# Patient Record
Sex: Male | Born: 1950 | Race: Black or African American | Hispanic: No | Marital: Single | Smoking: Former smoker
Health system: Southern US, Community
[De-identification: ages and names within clinical notes are randomized; demographics above are authoritative.]

## PROBLEM LIST (undated history)

## (undated) DIAGNOSIS — I10 Essential (primary) hypertension: Secondary | ICD-10-CM

---

## 2014-05-20 ENCOUNTER — Emergency Department (HOSPITAL_COMMUNITY)
Admission: EM | Admit: 2014-05-20 | Discharge: 2014-05-21 | Disposition: A | Discharge status: Home / Self Care | Attending: Emergency Medicine | Admitting: Emergency Medicine

## 2014-05-20 ENCOUNTER — Emergency Department (HOSPITAL_COMMUNITY)

## 2014-05-20 ENCOUNTER — Encounter (HOSPITAL_COMMUNITY): Payer: Self-pay | Admitting: Emergency Medicine

## 2014-05-20 DIAGNOSIS — K439 Ventral hernia without obstruction or gangrene: Secondary | ICD-10-CM | POA: Insufficient documentation

## 2014-05-20 DIAGNOSIS — R748 Abnormal levels of other serum enzymes: Secondary | ICD-10-CM

## 2014-05-20 DIAGNOSIS — R7401 Elevation of levels of liver transaminase levels: Secondary | ICD-10-CM | POA: Insufficient documentation

## 2014-05-20 DIAGNOSIS — R109 Unspecified abdominal pain: Secondary | ICD-10-CM

## 2014-05-20 DIAGNOSIS — N289 Disorder of kidney and ureter, unspecified: Secondary | ICD-10-CM

## 2014-05-20 DIAGNOSIS — I10 Essential (primary) hypertension: Secondary | ICD-10-CM | POA: Insufficient documentation

## 2014-05-20 DIAGNOSIS — D171 Benign lipomatous neoplasm of skin and subcutaneous tissue of trunk: Secondary | ICD-10-CM

## 2014-05-20 DIAGNOSIS — D1739 Benign lipomatous neoplasm of skin and subcutaneous tissue of other sites: Secondary | ICD-10-CM | POA: Insufficient documentation

## 2014-05-20 DIAGNOSIS — Z87891 Personal history of nicotine dependence: Secondary | ICD-10-CM | POA: Insufficient documentation

## 2014-05-20 DIAGNOSIS — R7402 Elevation of levels of lactic acid dehydrogenase (LDH): Secondary | ICD-10-CM | POA: Insufficient documentation

## 2014-05-20 DIAGNOSIS — R740 Nonspecific elevation of levels of transaminase and lactic acid dehydrogenase [LDH]: Secondary | ICD-10-CM

## 2014-05-20 DIAGNOSIS — R1084 Generalized abdominal pain: Secondary | ICD-10-CM | POA: Insufficient documentation

## 2014-05-20 HISTORY — DX: Essential (primary) hypertension: I10

## 2014-05-20 NOTE — ED Notes (Signed)
Pt c/o pain to right side which has a knot about the size of a tennis ball. Pt also c/o abdominal pain.  denies n/v.

## 2014-05-20 NOTE — ED Provider Notes (Signed)
CSN: [id number]     Arrival date & time 05/20/14  2127 History   First MD Initiated Contact with Patient 05/20/14 2326     Chief complaint: Abdominal pain  (Consider location/radiation/quality/duration/timing/severity/associated sxs/prior Treatment) The history is provided by the patient.   63 year old male comes in because of pain in the right side of his abdomen. Pain has been intermittent for several weeks and is associated with a knot on the right side of his abdomen. He has noted a knot for about a year and it tends to wax and wane in size. Pain was severe when it started at about 3 PM today, and he rated it a 10/10. It has subsided to a 3/10. He describes it as a hard cramp. There is associated nausea but no vomiting. He denies any diarrhea or constipation. Appetite has been normal. There's been no fever, chills, sweats. Nothing made the pain better nothing made it worse.   Past Medical History  Diagnosis Date  . Hypertension    History reviewed. No pertinent past surgical history. History reviewed. No pertinent family history. History  Substance Use Topics  . Smoking status: Former Research scientist (life sciences)  . Smokeless tobacco: Not on file  . Alcohol Use: No    Review of Systems  All other systems reviewed and are negative.     Allergies  Review of patient's allergies indicates no known allergies.  Home Medications   Prior to Admission medications   Not on File   BP 141/95  Pulse 62  Temp(Src) 97.9 F (36.6 C)  Resp 20  Ht 5\' 11"  (1.803 m)  Wt 208 lb (94.348 kg)  BMI 29.02 kg/m2  SpO2 94% Physical Exam  Nursing note and vitals reviewed.  63 year old male, resting comfortably and in no acute distress. Vital signs are significant for hypertension with blood pressure 141/95. Oxygen saturation is 94%, which is normal. Head is normocephalic and atraumatic. PERRLA, EOMI. Oropharynx is clear. Neck is nontender and supple without adenopathy or JVD. Back is nontender and there is no  CVA tenderness. Lungs are clear without rales, wheezes, or rhonchi. Chest is nontender. Heart has regular rate and rhythm without murmur. Abdomen is soft, flat, with mild tenderness diffusely. There is no rebound or guarding. There is a subcutaneous mass in the right lateral abdomen which has consistency of a lipoma. It measures 8 cm x 5 cm. There is no overlying erythema or warmth. There are no other masses or hepatosplenomegaly and peristalsis is normoactive. Extremities have no cyanosis or edema, full range of motion is present. Skin is warm and dry without rash. Neurologic: Mental status is normal, cranial nerves are intact, there are no motor or sensory deficits.  ED Course  Procedures (including critical care time) Labs Review Results for orders placed during the hospital encounter of 05/20/14  CBC WITH DIFFERENTIAL      Result Value Ref Range   WBC 3.7 (*) 4.0 - 10.5 K/uL   RBC 4.80  4.22 - 5.81 MIL/uL   Hemoglobin 13.4  13.0 - 17.0 g/dL   HCT 41.0  39.0 - 52.0 %   MCV 85.4  78.0 - 100.0 fL   MCH 27.9  26.0 - 34.0 pg   MCHC 32.7  30.0 - 36.0 g/dL   RDW 13.7  11.5 - 15.5 %   Platelets 193  150 - 400 K/uL   Neutrophils Relative % 55  43 - 77 %   Neutro Abs 2.0  1.7 - 7.7 K/uL  Lymphocytes Relative 34  12 - 46 %   Lymphs Abs 1.3  0.7 - 4.0 K/uL   Monocytes Relative 10  3 - 12 %   Monocytes Absolute 0.4  0.1 - 1.0 K/uL   Eosinophils Relative 1  0 - 5 %   Eosinophils Absolute 0.1  0.0 - 0.7 K/uL   Basophils Relative 0  0 - 1 %   Basophils Absolute 0.0  0.0 - 0.1 K/uL  URINALYSIS, ROUTINE W REFLEX MICROSCOPIC      Result Value Ref Range   Color, Urine YELLOW  YELLOW   APPearance CLEAR  CLEAR   Specific Gravity, Urine 1.020  1.005 - 1.030   pH 6.0  5.0 - 8.0   Glucose, UA NEGATIVE  NEGATIVE mg/dL   Hgb urine dipstick TRACE (*) NEGATIVE   Bilirubin Urine NEGATIVE  NEGATIVE   Ketones, ur NEGATIVE  NEGATIVE mg/dL   Protein, ur 100 (*) NEGATIVE mg/dL   Urobilinogen, UA 0.2   0.0 - 1.0 mg/dL   Nitrite NEGATIVE  NEGATIVE   Leukocytes, UA NEGATIVE  NEGATIVE  COMPREHENSIVE METABOLIC PANEL      Result Value Ref Range   Sodium 137  137 - 147 mEq/L   Potassium 3.9  3.7 - 5.3 mEq/L   Chloride 98  96 - 112 mEq/L   CO2 24  19 - 32 mEq/L   Glucose, Bld 105 (*) 70 - 99 mg/dL   BUN 20  6 - 23 mg/dL   Creatinine, Ser 1.57 (*) 0.50 - 1.35 mg/dL   Calcium 9.5  8.4 - 10.5 mg/dL   Total Protein 8.0  6.0 - 8.3 g/dL   Albumin 3.9  3.5 - 5.2 g/dL   AST 44 (*) 0 - 37 U/L   ALT 55 (*) 0 - 53 U/L   Alkaline Phosphatase 68  39 - 117 U/L   Total Bilirubin 0.3  0.3 - 1.2 mg/dL   GFR calc non Af Amer 45 (*) >90 mL/min   GFR calc Af Amer 52 (*) >90 mL/min  LIPASE, BLOOD      Result Value Ref Range   Lipase 75 (*) 11 - 59 U/L  URINE MICROSCOPIC-ADD ON      Result Value Ref Range   Squamous Epithelial / LPF RARE  RARE   WBC, UA 0-2  <3 WBC/hpf   RBC / HPF 0-2  <3 RBC/hpf   Bacteria, UA RARE  RARE  Imaging Review Ct Abdomen Pelvis W Contrast  05/21/2014   CLINICAL DATA:  Right-sided abdominal pain.  Abdominal mass.  EXAM: CT ABDOMEN AND PELVIS WITH CONTRAST  TECHNIQUE: Multidetector CT imaging of the abdomen and pelvis was performed using the standard protocol following bolus administration of intravenous contrast.  CONTRAST:  43mL OMNIPAQUE IOHEXOL 300 MG/ML SOLN, 146mL OMNIPAQUE IOHEXOL 300 MG/ML SOLN  COMPARISON:  None.  FINDINGS: There is a 6.8 x 8.5 x 2.0 cm subcutaneous lipoma superficial to the right lateral abdominal wall musculature. This has an appearance consistent with a benign lipoma.  There is also a 2.5 cm mass in the subcutaneous fat just to the left of midline 6 cm above the umbilicus which I think represents a tiny anterior abdominal wall hernia. There is some fat and some fluid or inflamed or infarcted fat in this tiny hernia.  Liver, biliary tree, spleen, pancreas, adrenal glands, and kidneys are normal except for a 13 mm cyst in the mid right kidney. The bowel is  normal including the terminal ileum  and appendix. Bladder is normal. No free air free fluid. The patient has severe degenerative disc disease at L4-5 and L5-S1.  IMPRESSION:  SIGNED REPORT: IMPRESSION:  SIGNED REPORT  1. There is a benign-appearing subcutaneous lipoma just superficial to the musculature of the right lateral abdominal wall. 2. Small midline abdominal wall hernias 6 cm above umbilicus. 3. Otherwise benign appearing abdomen.   Electronically Signed   By: Rozetta Nunnery M.D.   On: 05/21/2014 01:35   Images viewed by me.  MDM   Final diagnoses:  Abdominal pain  Lipoma of abdominal wall  Ventral hernia  Renal insufficiency  Elevated transaminase level  Elevated lipase    Abdominal pain of uncertain cause. I do not feel it is related to the mass, which appears to be a lipoma. He'll be sent for CT to make sure there is no other serious cause for his pain. He has no prior records and the [facility] system.  CT confirms that the mass in his right lateral abdomen is a lipoma. Small ventral hernia is present and some of his symptoms but no other pathology is identified. Laboratory workup shows mild elevation of lipase which is probably not acute pancreatitis. Mild elevation of transaminases is present of doubtful clinical significance. Mild renal insufficiency is present. I [facility] and creatinine is unchanged from baseline but transaminases are slightly elevated compared to what they had been recently. This will need to be followed as an outpatient. No indication for hospital medicine at this point. He is discharged with prescriptions for tramadol and ondansetron and is referred back to the prison physician.  Delora Fuel, MD 63/01/60 1093

## 2014-05-21 LAB — CBC WITH DIFFERENTIAL/PLATELET
Basophils Absolute: 0 10*3/uL (ref 0.0–0.1)
Basophils Relative: 0 % (ref 0–1)
EOS ABS: 0.1 10*3/uL (ref 0.0–0.7)
EOS PCT: 1 % (ref 0–5)
HEMATOCRIT: 41 % (ref 39.0–52.0)
Hemoglobin: 13.4 g/dL (ref 13.0–17.0)
LYMPHS ABS: 1.3 10*3/uL (ref 0.7–4.0)
Lymphocytes Relative: 34 % (ref 12–46)
MCH: 27.9 pg (ref 26.0–34.0)
MCHC: 32.7 g/dL (ref 30.0–36.0)
MCV: 85.4 fL (ref 78.0–100.0)
Monocytes Absolute: 0.4 10*3/uL (ref 0.1–1.0)
Monocytes Relative: 10 % (ref 3–12)
Neutro Abs: 2 10*3/uL (ref 1.7–7.7)
Neutrophils Relative %: 55 % (ref 43–77)
Platelets: 193 10*3/uL (ref 150–400)
RBC: 4.8 MIL/uL (ref 4.22–5.81)
RDW: 13.7 % (ref 11.5–15.5)
WBC: 3.7 10*3/uL — ABNORMAL LOW (ref 4.0–10.5)

## 2014-05-21 LAB — COMPREHENSIVE METABOLIC PANEL
ALT: 55 U/L — ABNORMAL HIGH (ref 0–53)
AST: 44 U/L — ABNORMAL HIGH (ref 0–37)
Albumin: 3.9 g/dL (ref 3.5–5.2)
Alkaline Phosphatase: 68 U/L (ref 39–117)
BILIRUBIN TOTAL: 0.3 mg/dL (ref 0.3–1.2)
BUN: 20 mg/dL (ref 6–23)
CHLORIDE: 98 meq/L (ref 96–112)
CO2: 24 meq/L (ref 19–32)
Calcium: 9.5 mg/dL (ref 8.4–10.5)
Creatinine, Ser: 1.57 mg/dL — ABNORMAL HIGH (ref 0.50–1.35)
GFR, EST AFRICAN AMERICAN: 52 mL/min — AB (ref >90)
GFR, EST NON AFRICAN AMERICAN: 45 mL/min — AB (ref >90)
GLUCOSE: 105 mg/dL — AB (ref 70–99)
Potassium: 3.9 mEq/L (ref 3.7–5.3)
Sodium: 137 mEq/L (ref 137–147)
Total Protein: 8 g/dL (ref 6.0–8.3)

## 2014-05-21 LAB — URINALYSIS, ROUTINE W REFLEX MICROSCOPIC
BILIRUBIN URINE: NEGATIVE
GLUCOSE, UA: NEGATIVE mg/dL
KETONES UR: NEGATIVE mg/dL
Leukocytes, UA: NEGATIVE
Nitrite: NEGATIVE
PH: 6 (ref 5.0–8.0)
Protein, ur: 100 mg/dL — AB
Specific Gravity, Urine: 1.02 (ref 1.005–1.030)
Urobilinogen, UA: 0.2 mg/dL (ref 0.0–1.0)

## 2014-05-21 LAB — URINE MICROSCOPIC-ADD ON

## 2014-05-21 LAB — LIPASE, BLOOD: LIPASE: 75 U/L — AB (ref 11–59)

## 2014-05-21 MED ORDER — IOHEXOL 300 MG/ML  SOLN
50.0000 mL | Freq: Once | INTRAMUSCULAR | Status: AC | PRN
  Administered 2014-05-21: 50 mL via ORAL

## 2014-05-21 MED ORDER — IOHEXOL 300 MG/ML  SOLN
100.0000 mL | Freq: Once | INTRAMUSCULAR | Status: AC | PRN
  Administered 2014-05-21: 100 mL via INTRAVENOUS

## 2014-05-21 MED ORDER — ONDANSETRON HCL 4 MG PO TABS: 4.0000 mg | ORAL_TABLET | Freq: Four times a day (QID) | ORAL | Status: DC | PRN

## 2014-05-21 MED ORDER — TRAMADOL HCL 50 MG PO TABS
50.0000 mg | ORAL_TABLET | Freq: Once | ORAL | Status: AC
  Administered 2014-05-21: 50 mg via ORAL
  Filled 2014-05-21: qty 1

## 2014-05-21 MED ORDER — TRAMADOL HCL 50 MG PO TABS: 50.0000 mg | ORAL_TABLET | Freq: Four times a day (QID) | ORAL | Status: DC | PRN

## 2014-05-21 NOTE — Discharge Instructions (Signed)
Abdominal Pain, Adult Many things can cause abdominal pain. Usually, abdominal pain is not caused by a disease and will improve without treatment. It can often be observed and treated at home. Your health care provider will do a physical exam and possibly order blood tests and X-rays to help determine the seriousness of your pain. However, in many cases, more time must pass before a clear cause of the pain can be found. Before that point, your health care provider may not know if you need more testing or further treatment. HOME CARE INSTRUCTIONS  Monitor your abdominal pain for any changes. The following actions may help to alleviate any discomfort you are experiencing:  Only take over-the-counter or prescription medicines as directed by your health care provider.  Do not take laxatives unless directed to do so by your health care provider.  Try a clear liquid diet (broth, tea, or water) as directed by your health care provider. Slowly move to a bland diet as tolerated. SEEK MEDICAL CARE IF:  You have unexplained abdominal pain.  You have abdominal pain associated with nausea or diarrhea.  You have pain when you urinate or have a bowel movement.  You experience abdominal pain that wakes you in the night.  You have abdominal pain that is worsened or improved by eating food.  You have abdominal pain that is worsened with eating fatty foods. SEEK IMMEDIATE MEDICAL CARE IF:   Your pain does not go away within 2 hours.  You have a fever.  You keep throwing up (vomiting).  Your pain is felt only in portions of the abdomen, such as the right side or the left lower portion of the abdomen.  You pass bloody or black tarry stools. MAKE SURE YOU:  Understand these instructions.   Will watch your condition.   Will get help right away if you are not doing well or get worse.  Document Released: 09/25/2005 Document Revised: 10/06/2013 Document Reviewed: 08/25/2013 [employer] Patient  Information 2014 [place].  Tramadol tablets What is this medicine? TRAMADOL (TRA ma dole) is a pain reliever. It is used to treat moderate to severe pain in adults. This medicine may be used for other purposes; ask your health care provider or pharmacist if you have questions. COMMON BRAND NAME(S): Ultram What should I tell my health care provider before I take this medicine? They need to know if you have any of these conditions: -brain tumor -depression -drug abuse or addiction -head injury -if you frequently drink alcohol containing drinks -kidney disease or trouble passing urine -liver disease -lung disease, asthma, or breathing problems -seizures or epilepsy -suicidal thoughts, plans, or attempt; a previous suicide attempt by you or a family member -an unusual or allergic reaction to tramadol, codeine, other medicines, foods, dyes, or preservatives -pregnant or trying to get pregnant -breast-feeding How should I use this medicine? Take this medicine by mouth with a full glass of water. Follow the directions on the prescription label. If the medicine upsets your stomach, take it with food or milk. Do not take more medicine than you are told to take. Talk to your pediatrician regarding the use of this medicine in children. Special care may be needed. Overdosage: If you think you have taken too much of this medicine contact a poison control center or emergency room at once. NOTE: This medicine is only for you. Do not share this medicine with others. What if I miss a dose? If you miss a dose, take it as soon as  you can. If it is almost time for your next dose, take only that dose. Do not take double or extra doses. What may interact with this medicine? Do not take this medicine with any of the following medications: -MAOIs like Carbex, Eldepryl, Marplan, Nardil, and Parnate This medicine may also interact with the following medications: -alcohol or medicines that contain  alcohol -antihistamines -benzodiazepines -bupropion -carbamazepine or oxcarbazepine -clozapine -cyclobenzaprine -digoxin -furazolidone -linezolid -medicines for depression, anxiety, or psychotic disturbances -medicines for migraine headache like almotriptan, eletriptan, frovatriptan, naratriptan, rizatriptan, sumatriptan, zolmitriptan -medicines for pain like pentazocine, buprenorphine, butorphanol, meperidine, nalbuphine, and propoxyphene -medicines for sleep -muscle relaxants -naltrexone -phenobarbital -phenothiazines like perphenazine, thioridazine, chlorpromazine, mesoridazine, fluphenazine, prochlorperazine, promazine, and trifluoperazine -procarbazine -warfarin This list may not describe all possible interactions. Give your health care provider a list of all the medicines, herbs, non-prescription drugs, or dietary supplements you use. Also tell them if you smoke, drink alcohol, or use illegal drugs. Some items may interact with your medicine. What should I watch for while using this medicine? Tell your doctor or health care professional if your pain does not go away, if it gets worse, or if you have new or a different type of pain. You may develop tolerance to the medicine. Tolerance means that you will need a higher dose of the medicine for pain relief. Tolerance is normal and is expected if you take this medicine for a long time. Do not suddenly stop taking your medicine because you may develop a severe reaction. Your body becomes used to the medicine. This does NOT mean you are addicted. Addiction is a behavior related to getting and using a drug for a non-medical reason. If you have pain, you have a medical reason to take pain medicine. Your doctor will tell you how much medicine to take. If your doctor wants you to stop the medicine, the dose will be slowly lowered over time to avoid any side effects. You may get drowsy or dizzy. Do not drive, use machinery, or do anything that  needs mental alertness until you know how this medicine affects you. Do not stand or sit up quickly, especially if you are an older patient. This reduces the risk of dizzy or fainting spells. Alcohol can increase or decrease the effects of this medicine. Avoid alcoholic drinks. You may have constipation. Try to have a bowel movement at least every 2 to 3 days. If you do not have a bowel movement for 3 days, call your doctor or health care professional. Your mouth may get dry. Chewing sugarless gum or sucking hard candy, and drinking plenty of water may help. Contact your doctor if the problem does not go away or is severe. What side effects may I notice from receiving this medicine? Side effects that you should report to your doctor or health care professional as soon as possible: -allergic reactions like skin rash, itching or hives, swelling of the face, lips, or tongue -breathing difficulties, wheezing -confusion -itching -light headedness or fainting spells -redness, blistering, peeling or loosening of the skin, including inside the mouth -seizures Side effects that usually do not require medical attention (report to your doctor or health care professional if they continue or are bothersome): -constipation -dizziness -drowsiness -headache -nausea, vomiting This list may not describe all possible side effects. Call your doctor for medical advice about side effects. You may report side effects to FDA at 1-800-FDA-1088. Where should I keep my medicine? Keep out of the reach of children. Store at room  temperature between 15 and 30 degrees C (59 and 86 degrees F). Keep container tightly closed. Throw away any unused medicine after the expiration date. NOTE: This sheet is a summary. It may not cover all possible information. If you have questions about this medicine, talk to your doctor, pharmacist, or health care provider.  2014, [employer]vier/[employer] Standard. (2010-08-29 11:55:44)  Ondansetron  tablets What is this medicine? ONDANSETRON (on DAN se tron) is used to treat nausea and vomiting caused by chemotherapy. It is also used to prevent or treat nausea and vomiting after surgery. This medicine may be used for other purposes; ask your health care provider or pharmacist if you have questions. COMMON BRAND NAME(S): Zofran What should I tell my health care provider before I take this medicine? They need to know if you have any of these conditions: -heart disease -history of irregular heartbeat -liver disease -low levels of magnesium or potassium in the blood -an unusual or allergic reaction to ondansetron, granisetron, other medicines, foods, dyes, or preservatives -pregnant or trying to get pregnant -breast-feeding How should I use this medicine? Take this medicine by mouth with a glass of water. Follow the directions on your prescription label. Take your doses at regular intervals. Do not take your medicine more often than directed. Talk to your pediatrician regarding the use of this medicine in children. Special care may be needed. Overdosage: If you think you have taken too much of this medicine contact a poison control center or emergency room at once. NOTE: This medicine is only for you. Do not share this medicine with others. What if I miss a dose? If you miss a dose, take it as soon as you can. If it is almost time for your next dose, take only that dose. Do not take double or extra doses. What may interact with this medicine? Do not take this medicine with any of the following medications: -apomorphine -certain medicines for fungal infections like fluconazole, itraconazole, ketoconazole, posaconazole, voriconazole -cisapride -dofetilide -dronedarone -pimozide -thioridazine -ziprasidone  This medicine may also interact with the following medications: -carbamazepine -certain medicines for depression, anxiety, or psychotic disturbances -fentanyl -linezolid -MAOIs  like Carbex, Eldepryl, Marplan, Nardil, and Parnate -methylene blue (injected into a vein) -other medicines that prolong the QT interval (cause an abnormal heart rhythm) -phenytoin -rifampicin -tramadol This list may not describe all possible interactions. Give your health care provider a list of all the medicines, herbs, non-prescription drugs, or dietary supplements you use. Also tell them if you smoke, drink alcohol, or use illegal drugs. Some items may interact with your medicine. What should I watch for while using this medicine? Check with your doctor or health care professional right away if you have any sign of an allergic reaction. What side effects may I notice from receiving this medicine? Side effects that you should report to your doctor or health care professional as soon as possible: -allergic reactions like skin rash, itching or hives, swelling of the face, lips or tongue -breathing problems -confusion -dizziness -fast or irregular heartbeat -feeling faint or lightheaded, falls -fever and chills -loss of balance or coordination -seizures -sweating -swelling of the hands or feet -tightness in the chest -tremors -unusually weak or tired Side effects that usually do not require medical attention (report to your doctor or health care professional if they continue or are bothersome): -constipation or diarrhea -headache This list may not describe all possible side effects. Call your doctor for medical advice about side effects. You may report side effects  to FDA at 1-800-FDA-1088. Where should I keep my medicine? Keep out of the reach of children. Store between 2 and 30 degrees C (36 and 86 degrees F). Throw away any unused medicine after the expiration date. NOTE: This sheet is a summary. It may not cover all possible information. If you have questions about this medicine, talk to your doctor, pharmacist, or health care provider.  2014, Elsevier/Gold Standard. (2013-09-22  16:27:45)

## 2014-05-21 NOTE — ED Notes (Signed)
Report given to T. Winn RN 

## 2014-05-21 NOTE — ED Notes (Signed)
IV d/c'd; catheter intact and site WNL.  Discharge instructions and prescriptions given and reviewed with patient and Designer, industrial/product.  Patient verbalized understanding of sedating effects of Tramadol and to follow up as needed.  [facility] to [facility].

## 2014-07-26 ENCOUNTER — Emergency Department (HOSPITAL_COMMUNITY)

## 2014-07-26 ENCOUNTER — Encounter (HOSPITAL_COMMUNITY): Payer: Self-pay | Admitting: Emergency Medicine

## 2014-07-26 ENCOUNTER — Emergency Department (HOSPITAL_COMMUNITY)
Admission: EM | Admit: 2014-07-26 | Discharge: 2014-07-27 | Disposition: A | Discharge status: Home / Self Care | Attending: Emergency Medicine | Admitting: Emergency Medicine

## 2014-07-26 DIAGNOSIS — Z7982 Long term (current) use of aspirin: Secondary | ICD-10-CM | POA: Insufficient documentation

## 2014-07-26 DIAGNOSIS — M791 Myalgia, unspecified site: Secondary | ICD-10-CM

## 2014-07-26 DIAGNOSIS — I495 Sick sinus syndrome: Secondary | ICD-10-CM | POA: Insufficient documentation

## 2014-07-26 DIAGNOSIS — Z87891 Personal history of nicotine dependence: Secondary | ICD-10-CM | POA: Insufficient documentation

## 2014-07-26 DIAGNOSIS — R000 Tachycardia, unspecified: Secondary | ICD-10-CM | POA: Insufficient documentation

## 2014-07-26 DIAGNOSIS — N289 Disorder of kidney and ureter, unspecified: Secondary | ICD-10-CM | POA: Insufficient documentation

## 2014-07-26 DIAGNOSIS — R5381 Other malaise: Secondary | ICD-10-CM | POA: Insufficient documentation

## 2014-07-26 DIAGNOSIS — R61 Generalized hyperhidrosis: Secondary | ICD-10-CM | POA: Insufficient documentation

## 2014-07-26 DIAGNOSIS — I10 Essential (primary) hypertension: Secondary | ICD-10-CM | POA: Insufficient documentation

## 2014-07-26 DIAGNOSIS — R52 Pain, unspecified: Secondary | ICD-10-CM | POA: Insufficient documentation

## 2014-07-26 DIAGNOSIS — R110 Nausea: Secondary | ICD-10-CM | POA: Insufficient documentation

## 2014-07-26 DIAGNOSIS — R5383 Other fatigue: Principal | ICD-10-CM

## 2014-07-26 DIAGNOSIS — R6883 Chills (without fever): Secondary | ICD-10-CM | POA: Insufficient documentation

## 2014-07-26 DIAGNOSIS — Z79899 Other long term (current) drug therapy: Secondary | ICD-10-CM | POA: Insufficient documentation

## 2014-07-26 LAB — CBC WITH DIFFERENTIAL/PLATELET
BASOS ABS: 0 10*3/uL (ref 0.0–0.1)
Basophils Relative: 0 % (ref 0–1)
EOS ABS: 0 10*3/uL (ref 0.0–0.7)
EOS PCT: 1 % (ref 0–5)
HEMATOCRIT: 43.6 % (ref 39.0–52.0)
Hemoglobin: 14.5 g/dL (ref 13.0–17.0)
LYMPHS PCT: 19 % (ref 12–46)
Lymphs Abs: 0.4 10*3/uL — ABNORMAL LOW (ref 0.7–4.0)
MCH: 28.5 pg (ref 26.0–34.0)
MCHC: 33.3 g/dL (ref 30.0–36.0)
MCV: 85.7 fL (ref 78.0–100.0)
MONO ABS: 0 10*3/uL — AB (ref 0.1–1.0)
Monocytes Relative: 0 % — ABNORMAL LOW (ref 3–12)
Neutro Abs: 1.9 10*3/uL (ref 1.7–7.7)
Neutrophils Relative %: 80 % — ABNORMAL HIGH (ref 43–77)
Platelets: 165 10*3/uL (ref 150–400)
RBC: 5.09 MIL/uL (ref 4.22–5.81)
RDW: 14.3 % (ref 11.5–15.5)
WBC: 2.4 10*3/uL — AB (ref 4.0–10.5)

## 2014-07-26 MED ORDER — KETOROLAC TROMETHAMINE 30 MG/ML IJ SOLN
30.0000 mg | Freq: Once | INTRAMUSCULAR | Status: AC
  Administered 2014-07-26: 30 mg via INTRAVENOUS
  Filled 2014-07-26: qty 1

## 2014-07-26 MED ORDER — SODIUM CHLORIDE 0.9 % IV SOLN
1000.0000 mL | INTRAVENOUS | Status: DC
  Administered 2014-07-26: 1000 mL via INTRAVENOUS

## 2014-07-26 MED ORDER — SODIUM CHLORIDE 0.9 % IV SOLN
1000.0000 mL | Freq: Once | INTRAVENOUS | Status: AC
  Administered 2014-07-26: 1000 mL via INTRAVENOUS

## 2014-07-26 MED ORDER — ONDANSETRON HCL 4 MG/2ML IJ SOLN
4.0000 mg | Freq: Once | INTRAMUSCULAR | Status: AC
  Administered 2014-07-26: 4 mg via INTRAVENOUS
  Filled 2014-07-26: qty 2

## 2014-07-26 NOTE — ED Notes (Signed)
Patient c/o general body weakness, fever and chills. Pat sts nausea, denies vomiting and diarrhea. Patient is A&O X4

## 2014-07-26 NOTE — ED Notes (Signed)
Patient complaining of generalized body aches, chills, fever, and cough.

## 2014-07-26 NOTE — ED Provider Notes (Signed)
CSN: [id number]     Arrival date & time 07/26/14  2203 History  This chart was scribed for Raymond Fuel, MD by Vernell Barrier, ED scribe. This patient was seen in room [facility]07/[facility]07 and the patient's care was started at 11:21 PM.    Chief Complaint  Patient presents with  . Generalized Body Aches   The history is provided by the patient. No language interpreter was used.   HPI Comments: Raymond Charles is a 63 y.o. male w/ hx of HTN presents to the Emergency Department BIB police complaining of gradually worsening generalized myalgias; onset this PM. Notes worsening dry cough onset 1 day prior. Pt also notes fever, chills, SOB, nausea, and diaphoresis. States he has a hernia; pain is worsened with coughing. Denies vomiting.   Past Medical History  Diagnosis Date  . Hypertension    History reviewed. No pertinent past surgical history. History reviewed. No pertinent family history. History  Substance Use Topics  . Smoking status: Former Research scientist (life sciences)  . Smokeless tobacco: Not on file  . Alcohol Use: No    Review of Systems  Constitutional: Positive for fever, chills and diaphoresis.  Respiratory: Positive for cough and shortness of breath.   Gastrointestinal: Positive for nausea. Negative for vomiting.  Musculoskeletal: Positive for myalgias.  All other systems reviewed and are negative.  Allergies  Review of patient's allergies indicates no known allergies.  Home Medications   Prior to Admission medications   Medication Sig Start Date End Date Taking? Authorizing Provider  amLODipine (NORVASC) 5 MG tablet Take 5 mg by mouth every morning.   Yes Historical Provider, MD  aspirin EC 81 MG tablet Take 81 mg by mouth every morning.   Yes Historical Provider, MD  hydrochlorothiazide (HYDRODIURIL) 25 MG tablet Take 25 mg by mouth every morning.   Yes Historical Provider, MD  lisinopril (PRINIVIL,ZESTRIL) 20 MG tablet Take 20 mg by mouth every morning.   Yes Historical Provider, MD   ranitidine (ZANTAC) 300 MG tablet Take 300 mg by mouth 2 (two) times daily.   Yes Historical Provider, MD   Triage vitals: BP 144/95  Pulse 132  Temp(Src) 99.3 F (37.4 C) (Oral)  Ht 5\' 8"  (1.727 m)  Wt 212 lb (96.163 kg)  BMI 32.24 kg/m2  SpO2 99%  Physical Exam  Nursing note and vitals reviewed. Constitutional: He is oriented to person, place, and time. He appears well-developed and well-nourished. No distress.  Appears uncomfortable.  HENT:  Head: Normocephalic and atraumatic.  Eyes: Conjunctivae and EOM are normal. Pupils are equal, round, and reactive to light.  Neck: Normal range of motion. Neck supple. No JVD present.  Cardiovascular: Regular rhythm and normal heart sounds.  Tachycardia present.   No murmur heard. Pulmonary/Chest: Effort normal and breath sounds normal. No respiratory distress. He has no wheezes. He has no rales.  Abdominal: Soft. Bowel sounds are normal. He exhibits no distension and no mass. There is no tenderness.  Musculoskeletal: Normal range of motion. He exhibits no edema.  Lymphadenopathy:    He has no cervical adenopathy.  Neurological: He is alert and oriented to person, place, and time. He has normal reflexes. No cranial nerve deficit.  Skin: Skin is warm and dry. No rash noted.  Psychiatric: He has a normal mood and affect. His behavior is normal.    ED Course  Procedures (including critical care time) DIAGNOSTIC STUDIES: Oxygen Saturation is 99% on room air, normal by my interpretation.    COORDINATION OF CARE: At 11:24  PM: Discussed treatment plan with patient which includes CXR. Patient agrees.    Labs Review Results for orders placed during the hospital encounter of 07/26/14  CBC WITH DIFFERENTIAL      Result Value Ref Range   WBC 2.4 (*) 4.0 - 10.5 K/uL   RBC 5.09  4.22 - 5.81 MIL/uL   Hemoglobin 14.5  13.0 - 17.0 g/dL   HCT 43.6  39.0 - 52.0 %   MCV 85.7  78.0 - 100.0 fL   MCH 28.5  26.0 - 34.0 pg   MCHC 33.3  30.0 - 36.0  g/dL   RDW 14.3  11.5 - 15.5 %   Platelets 165  150 - 400 K/uL   Neutrophils Relative % 80 (*) 43 - 77 %   Neutro Abs 1.9  1.7 - 7.7 K/uL   Lymphocytes Relative 19  12 - 46 %   Lymphs Abs 0.4 (*) 0.7 - 4.0 K/uL   Monocytes Relative 0 (*) 3 - 12 %   Monocytes Absolute 0.0 (*) 0.1 - 1.0 K/uL   Eosinophils Relative 1  0 - 5 %   Eosinophils Absolute 0.0  0.0 - 0.7 K/uL   Basophils Relative 0  0 - 1 %   Basophils Absolute 0.0  0.0 - 0.1 K/uL  BASIC METABOLIC PANEL      Result Value Ref Range   Sodium 138  137 - 147 mEq/L   Potassium 3.7  3.7 - 5.3 mEq/L   Chloride 99  96 - 112 mEq/L   CO2 24  19 - 32 mEq/L   Glucose, Bld 102 (*) 70 - 99 mg/dL   BUN 19  6 - 23 mg/dL   Creatinine, Ser 2.07 (*) 0.50 - 1.35 mg/dL   Calcium 9.2  8.4 - 10.5 mg/dL   GFR calc non Af Amer 32 (*) >90 mL/min   GFR calc Af Amer 38 (*) >90 mL/min   Anion gap 15  5 - 15  TROPONIN I      Result Value Ref Range   Troponin I <0.30  <0.30 ng/mL  URINALYSIS, ROUTINE W REFLEX MICROSCOPIC      Result Value Ref Range   Color, Urine YELLOW  YELLOW   APPearance CLEAR  CLEAR   Specific Gravity, Urine 1.015  1.005 - 1.030   pH 6.0  5.0 - 8.0   Glucose, UA NEGATIVE  NEGATIVE mg/dL   Hgb urine dipstick NEGATIVE  NEGATIVE   Bilirubin Urine NEGATIVE  NEGATIVE   Ketones, ur NEGATIVE  NEGATIVE mg/dL   Protein, ur 100 (*) NEGATIVE mg/dL   Urobilinogen, UA 0.2  0.0 - 1.0 mg/dL   Nitrite NEGATIVE  NEGATIVE   Leukocytes, UA NEGATIVE  NEGATIVE  HEPATIC FUNCTION PANEL      Result Value Ref Range   Total Protein 7.9  6.0 - 8.3 g/dL   Albumin 3.8  3.5 - 5.2 g/dL   AST 22  0 - 37 U/L   ALT 24  0 - 53 U/L   Alkaline Phosphatase 73  39 - 117 U/L   Total Bilirubin 0.4  0.3 - 1.2 mg/dL   Bilirubin, Direct <0.2  0.0 - 0.3 mg/dL   Indirect Bilirubin NOT CALCULATED  0.3 - 0.9 mg/dL  LACTIC ACID, PLASMA      Result Value Ref Range   Lactic Acid, Venous 2.5 (*) 0.5 - 2.2 mmol/L  URINE MICROSCOPIC-ADD ON      Result Value Ref Range    Squamous Epithelial /  LPF RARE  RARE   WBC, UA 0-2  <3 WBC/hpf   RBC / HPF 0-2  <3 RBC/hpf   Bacteria, UA FEW (*) RARE   Imaging Review Dg Chest 2 View  07/27/2014   CLINICAL DATA:  Cough, congestion, and fever tonight. Generalized body aches.  EXAM: CHEST  2 VIEW  COMPARISON:  None.  FINDINGS: Shallow inspiration. Heart size and pulmonary vascularity are normal. Calcified and tortuous aorta. Prominence of the right hilum may be vascular but hilar lymphadenopathy is not excluded. Consider follow-up with upright PA and lateral chest views for better evaluation. Probable linear atelectasis in the left lung base. No focal consolidation or airspace disease. No blunting of costophrenic angles.  IMPRESSION: Shallow inspiration with atelectasis in the left lung base. Nonspecific prominence of the right hilum. Consider follow-up with upright PA and lateral views of the chest.   Electronically Signed   By: Lucienne Capers M.D.   On: 07/27/2014 00:05     EKG Interpretation   Date/Time:  Tuesday July 26 2014 22:25:06 EDT Ventricular Rate:  122 PR Interval:  166 QRS Duration: 83 QT Interval:  297 QTC Calculation: 423 R Axis:   -2 Text Interpretation:  Sinus tachycardia Probable left atrial enlargement  Baseline wander in lead(s) V4 No old tracing to compare Confirmed by [facility]   MD, Jaevon Paras ([id number]) on 07/26/2014 11:25:07 PM      MDM   Final diagnoses:  Myalgia  Sinus tachycardia  Renal insufficiency   Cough, body aches, tachycardia of uncertain cause. Patient does not appear overtly septic and is not febrile. He'll be given IV hydration and screening labs and chest x-ray are obtained.  Chest x-ray shows no evidence of pneumonia. WBC is low however he has demonstrated leukopenia in the past. Creatinine has risen somewhat over baseline and lactic acid level is minimally elevated.Marland Kitchen Heart rate has only come down slightly with 1 L bolus and is given a second liter.  Heart rate has come down to  101 and the patient is resting comfortably. He continues to appear nontoxic. He most likely has a viral illness to account for his body aches. Because of bump in creatinine, he is advised not to take any NSAIDs and he is advised of his creatinine level rechecked in the next several days.  I personally performed the services described in this documentation, which was scribed in my presence. The recorded information has been reviewed and is accurate.     Raymond Fuel, MD 27/51/70 [id number]

## 2014-07-27 LAB — URINALYSIS, ROUTINE W REFLEX MICROSCOPIC
Bilirubin Urine: NEGATIVE
Glucose, UA: NEGATIVE mg/dL
HGB URINE DIPSTICK: NEGATIVE
Ketones, ur: NEGATIVE mg/dL
Leukocytes, UA: NEGATIVE
Nitrite: NEGATIVE
Protein, ur: 100 mg/dL — AB
SPECIFIC GRAVITY, URINE: 1.015 (ref 1.005–1.030)
UROBILINOGEN UA: 0.2 mg/dL (ref 0.0–1.0)
pH: 6 (ref 5.0–8.0)

## 2014-07-27 LAB — TROPONIN I: Troponin I: <0.3 ng/mL (ref <0.30)

## 2014-07-27 LAB — BASIC METABOLIC PANEL
ANION GAP: 15 (ref 5–15)
BUN: 19 mg/dL (ref 6–23)
CALCIUM: 9.2 mg/dL (ref 8.4–10.5)
CO2: 24 meq/L (ref 19–32)
Chloride: 99 mEq/L (ref 96–112)
Creatinine, Ser: 2.07 mg/dL — ABNORMAL HIGH (ref 0.50–1.35)
GFR calc Af Amer: 38 mL/min — ABNORMAL LOW (ref >90)
GFR, EST NON AFRICAN AMERICAN: 32 mL/min — AB (ref >90)
Glucose, Bld: 102 mg/dL — ABNORMAL HIGH (ref 70–99)
Potassium: 3.7 mEq/L (ref 3.7–5.3)
Sodium: 138 mEq/L (ref 137–147)

## 2014-07-27 LAB — HEPATIC FUNCTION PANEL
ALT: 24 U/L (ref 0–53)
AST: 22 U/L (ref 0–37)
Albumin: 3.8 g/dL (ref 3.5–5.2)
Alkaline Phosphatase: 73 U/L (ref 39–117)
Bilirubin, Direct: <0.2 mg/dL (ref 0.0–0.3)
Total Bilirubin: 0.4 mg/dL (ref 0.3–1.2)
Total Protein: 7.9 g/dL (ref 6.0–8.3)

## 2014-07-27 LAB — URINE MICROSCOPIC-ADD ON

## 2014-07-27 LAB — LACTIC ACID, PLASMA: Lactic Acid, Venous: 2.5 mmol/L — ABNORMAL HIGH (ref 0.5–2.2)

## 2014-07-27 MED ORDER — SODIUM CHLORIDE 0.9 % IV BOLUS (SEPSIS)
1000.0000 mL | Freq: Once | INTRAVENOUS | Status: AC
  Administered 2014-07-27: 1000 mL via INTRAVENOUS

## 2014-07-27 NOTE — ED Notes (Signed)
Patient and prison nurse verbalize understanding of discharge instructions, and follow up care. Patient ambulatory out of department at this time escorted by officer

## 2014-07-27 NOTE — ED Notes (Signed)
Report called to nurse Sabra Heck at [facility].

## 2014-07-27 NOTE — ED Notes (Signed)
Patients heart rate is elevated at this time, went to patients room and checked on patient. Patient is asymptomatic at this time. Denies chest pain, denies shortness of breath. 12 lead EKG obtained at this time and given to EDP. No new orders at this time.

## 2014-07-27 NOTE — Discharge Instructions (Signed)
Your creatinine (a blood test of your kidney) is somewhat worse than it had been recently. Do not take ibuprofen or naproxen because they can be harmful to your kidney. It is okay to take acetaminophen for pain or fever. Please see your doctor in the next several days to recheck the creatinine level to make sure it is not getting worse.

## 2016-03-10 IMAGING — CT CT ABD-PELV W/ CM
2 of 4 series · 16 of 46 positions shown, 18 images · IV contrast (omnipaque)
Comparison: None.

CLINICAL DATA: Right-sided abdominal pain.  Abdominal mass.

EXAM:
CT ABDOMEN AND PELVIS WITH CONTRAST
TECHNIQUE: Multidetector CT imaging of the abdomen and pelvis was performed
using the standard protocol following bolus administration of
intravenous contrast.
CONTRAST:  50mL OMNIPAQUE IOHEXOL 300 MG/ML SOLN, 100mL OMNIPAQUE
IOHEXOL 300 MG/ML SOLN

[Series 2: abd_pel_with 5.0 b40f · axial · 0.87mm/px · z∈[-386,+64]mm · 13 of 101 slices shown, 15 images]
[im 6/101  soft-tissue]
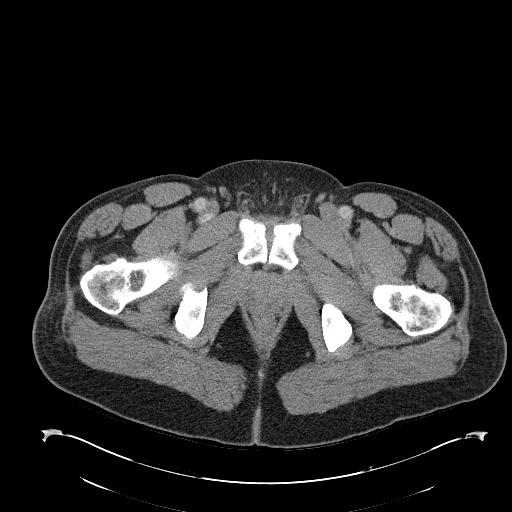
[im 6/101  bone]
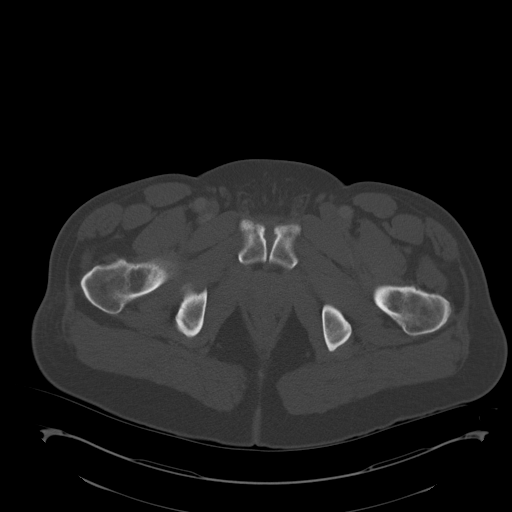
[im 16/101  soft-tissue]
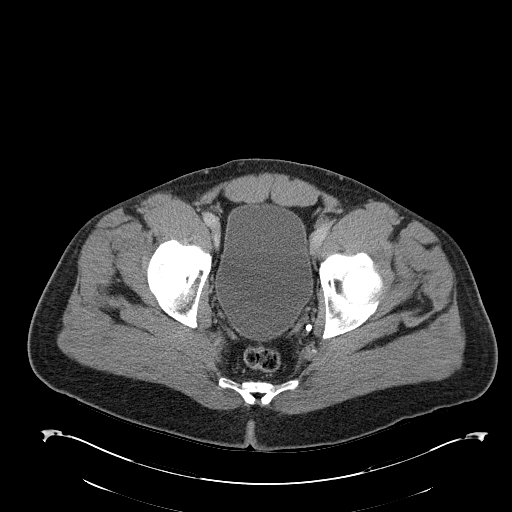
[im 21/101  soft-tissue]
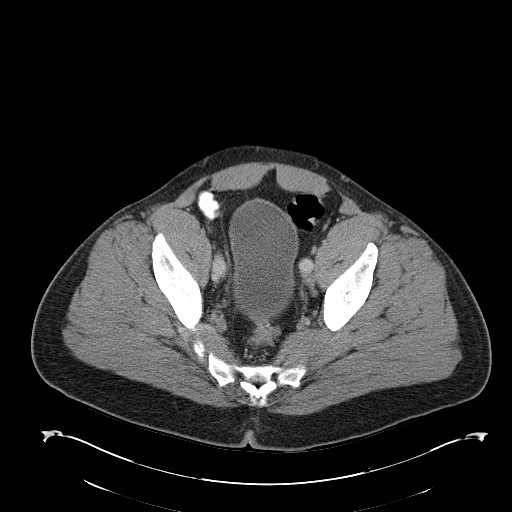
[im 31/101  soft-tissue]
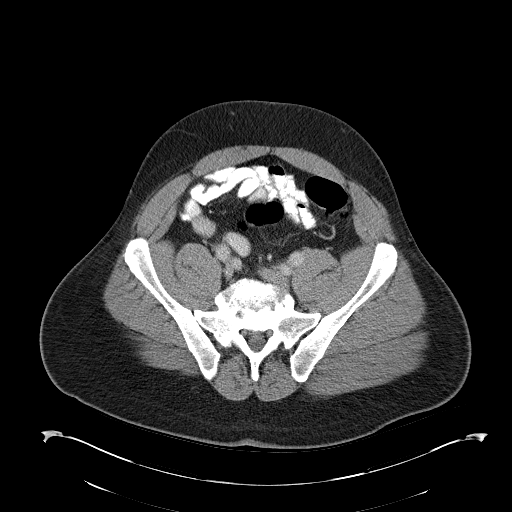
[im 36/101  soft-tissue]
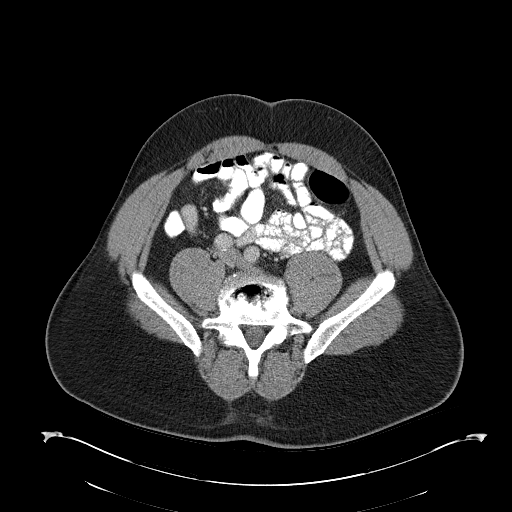
[im 46/101  soft-tissue]
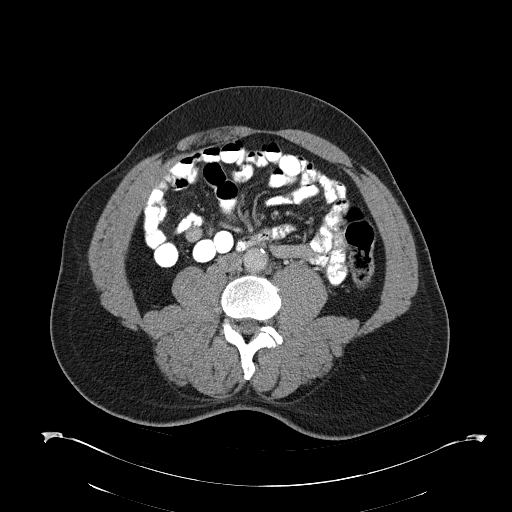
[im 51/101  soft-tissue]
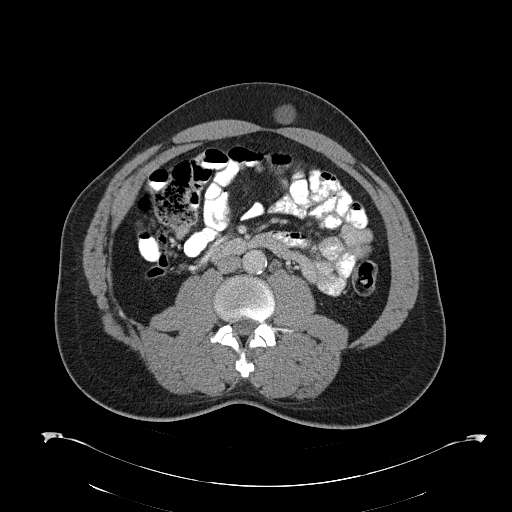
[im 56/101  soft-tissue]
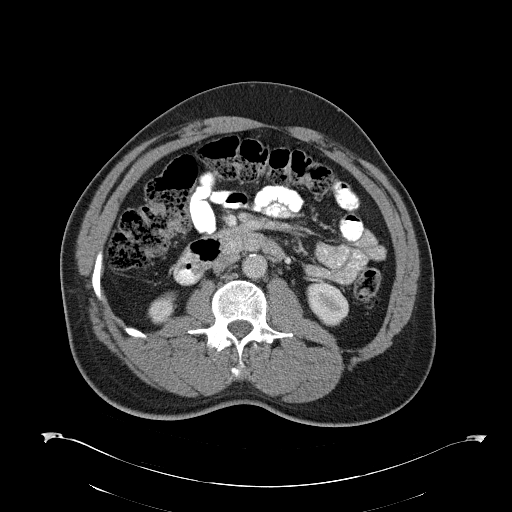
[im 66/101  soft-tissue]
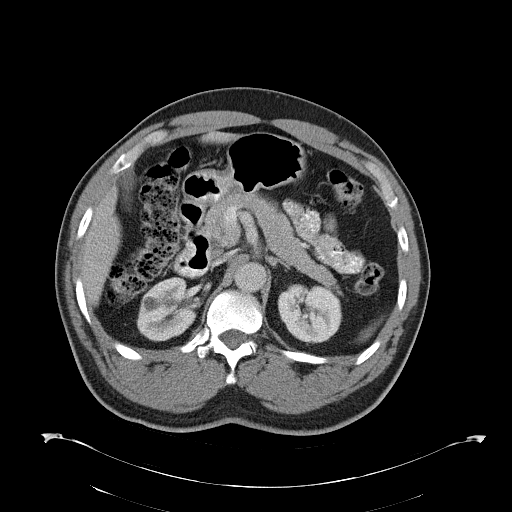
[im 66/101  bone]
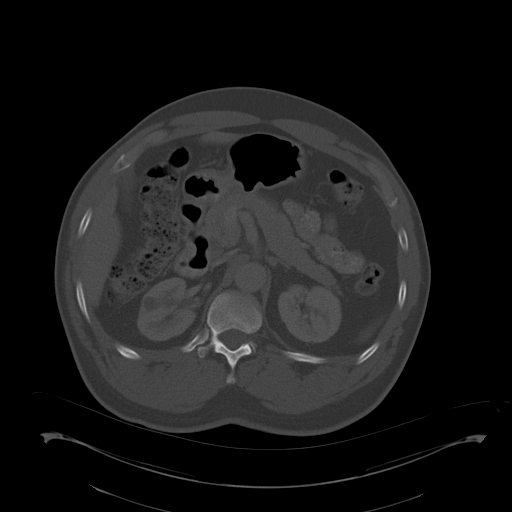
[im 71/101  soft-tissue]
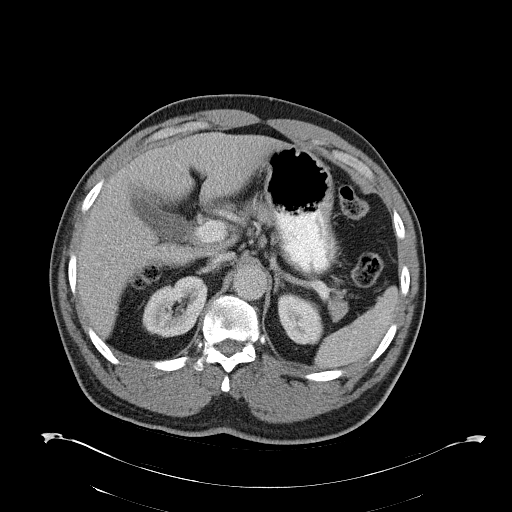
[im 81/101  soft-tissue]
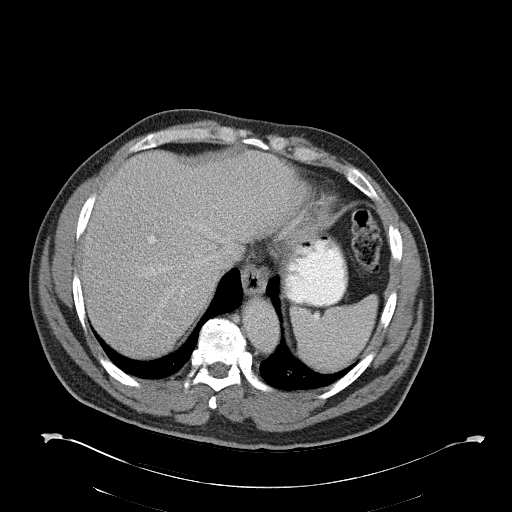
[im 86/101  soft-tissue]
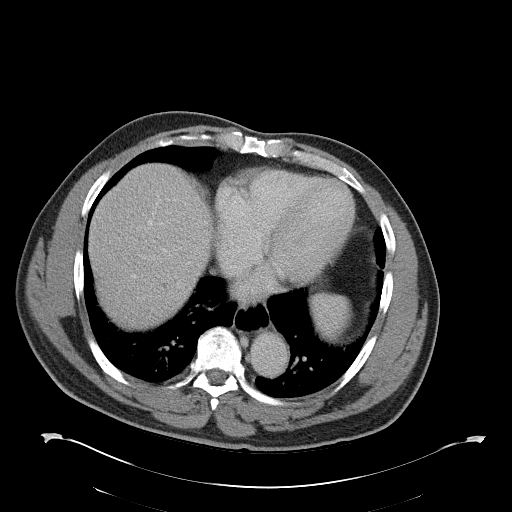
[im 96/101  soft-tissue]
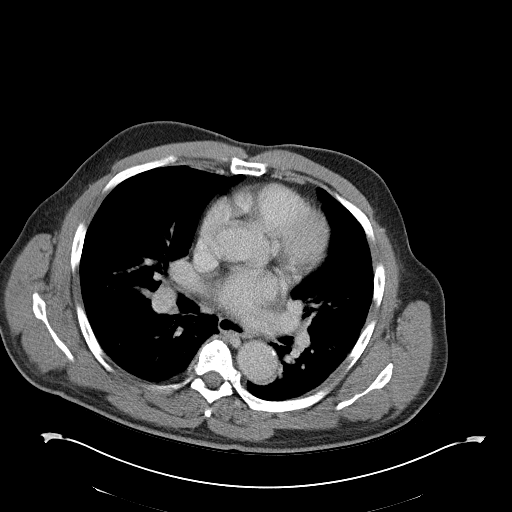

[Series 4: abd_pel_with 3.0 spo cor · coronal · 0.85mm/px · 3 of 117 slices shown]
[im 39/117  soft-tissue]
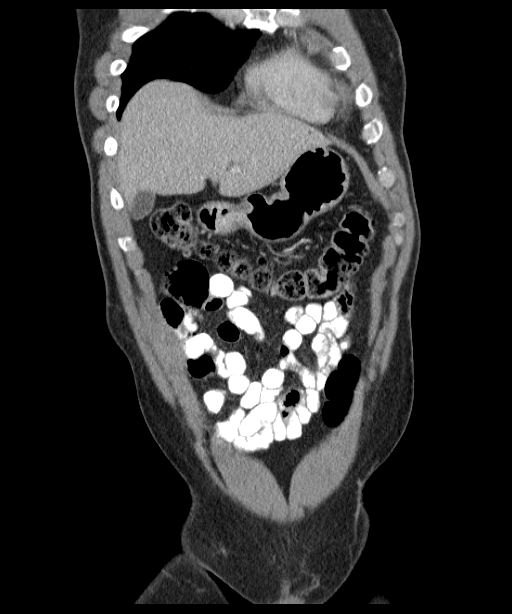
[im 52/117  soft-tissue]
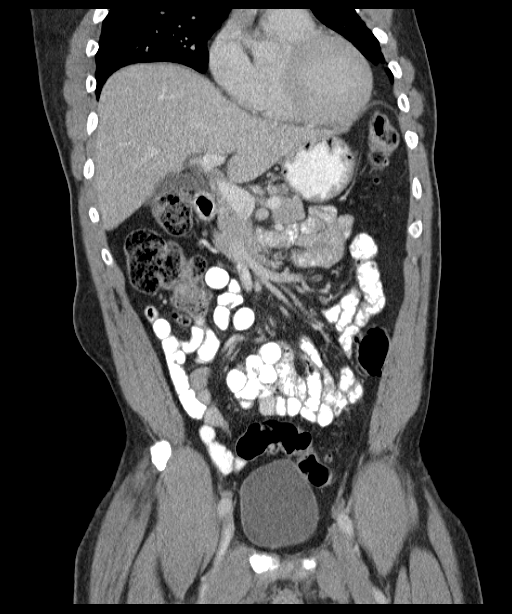
[im 65/117  soft-tissue]
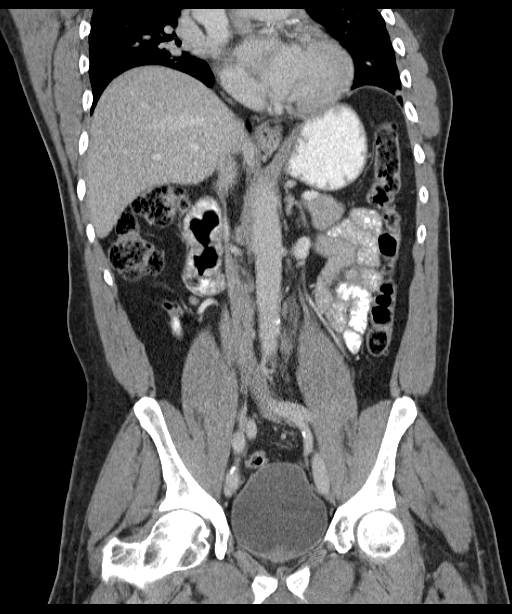

[16 of 46 positions shown; findings below may reference images not displayed]

FINDINGS: There is a 6.8 x 8.5 x 2.0 cm subcutaneous lipoma superficial to the
right lateral abdominal wall musculature. This has an appearance
consistent with a benign lipoma.

There is also a 2.5 cm mass in the subcutaneous fat just to the left
of midline 6 cm above the umbilicus which I think represents a tiny
anterior abdominal wall hernia. There is some fat and some fluid or
inflamed or infarcted fat in this tiny hernia.

Liver, biliary tree, spleen, pancreas, adrenal glands, and kidneys
are normal except for a 13 mm cyst in the mid right kidney. The
bowel is normal including the terminal ileum and appendix. Bladder
is normal. No free air free fluid. The patient has severe
degenerative disc disease at L4-5 and L5-S1.
IMPRESSION: SIGNED REPORT:
IMPRESSION: SIGNED REPORT

1. There is a benign-appearing subcutaneous lipoma just superficial
to the musculature of the right lateral abdominal wall.
2. Small midline abdominal wall hernias 6 cm above umbilicus.
3. Otherwise benign appearing abdomen.

## 2016-05-15 IMAGING — CR DG CHEST 2V
2 series · 2 of 2 positions shown · non-contrast
Comparison: None.

CLINICAL DATA: Cough, congestion, and fever tonight. Generalized
body aches.

EXAM:
CHEST  2 VIEW

[view not recorded (1 of 2)]
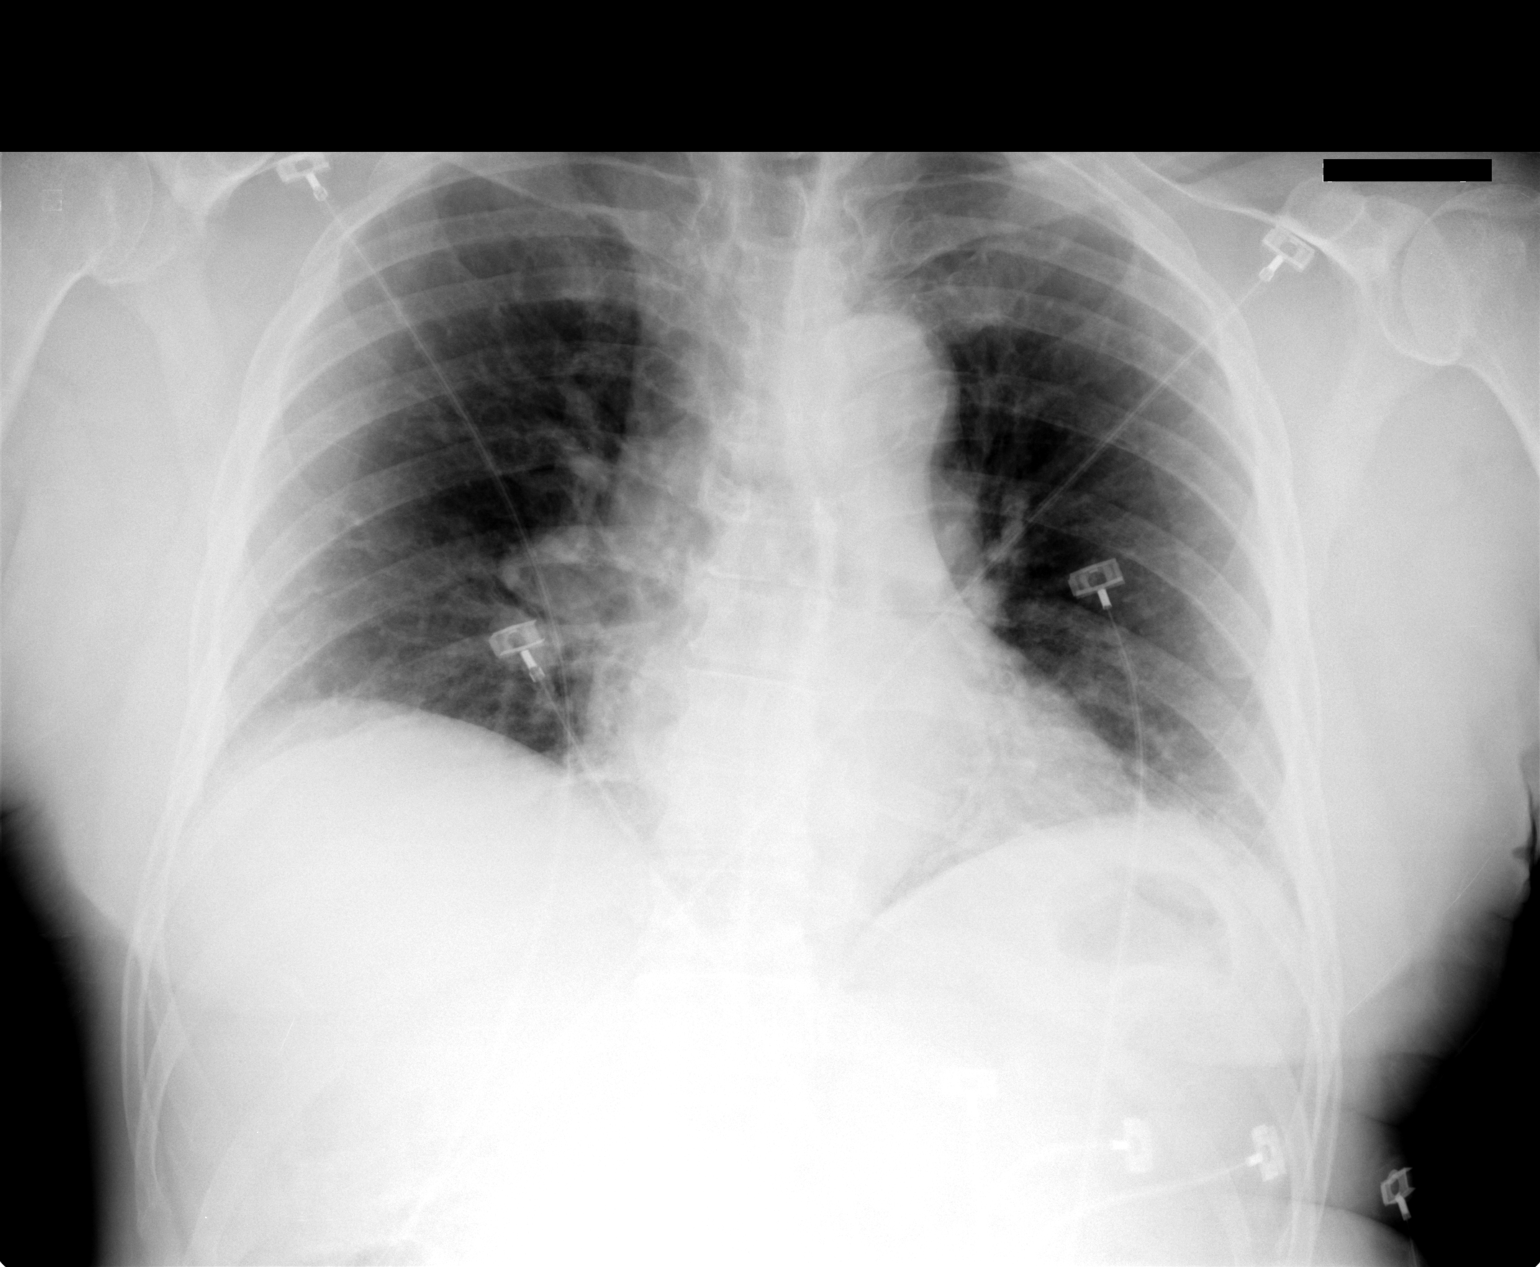

[view not recorded (2 of 2)]
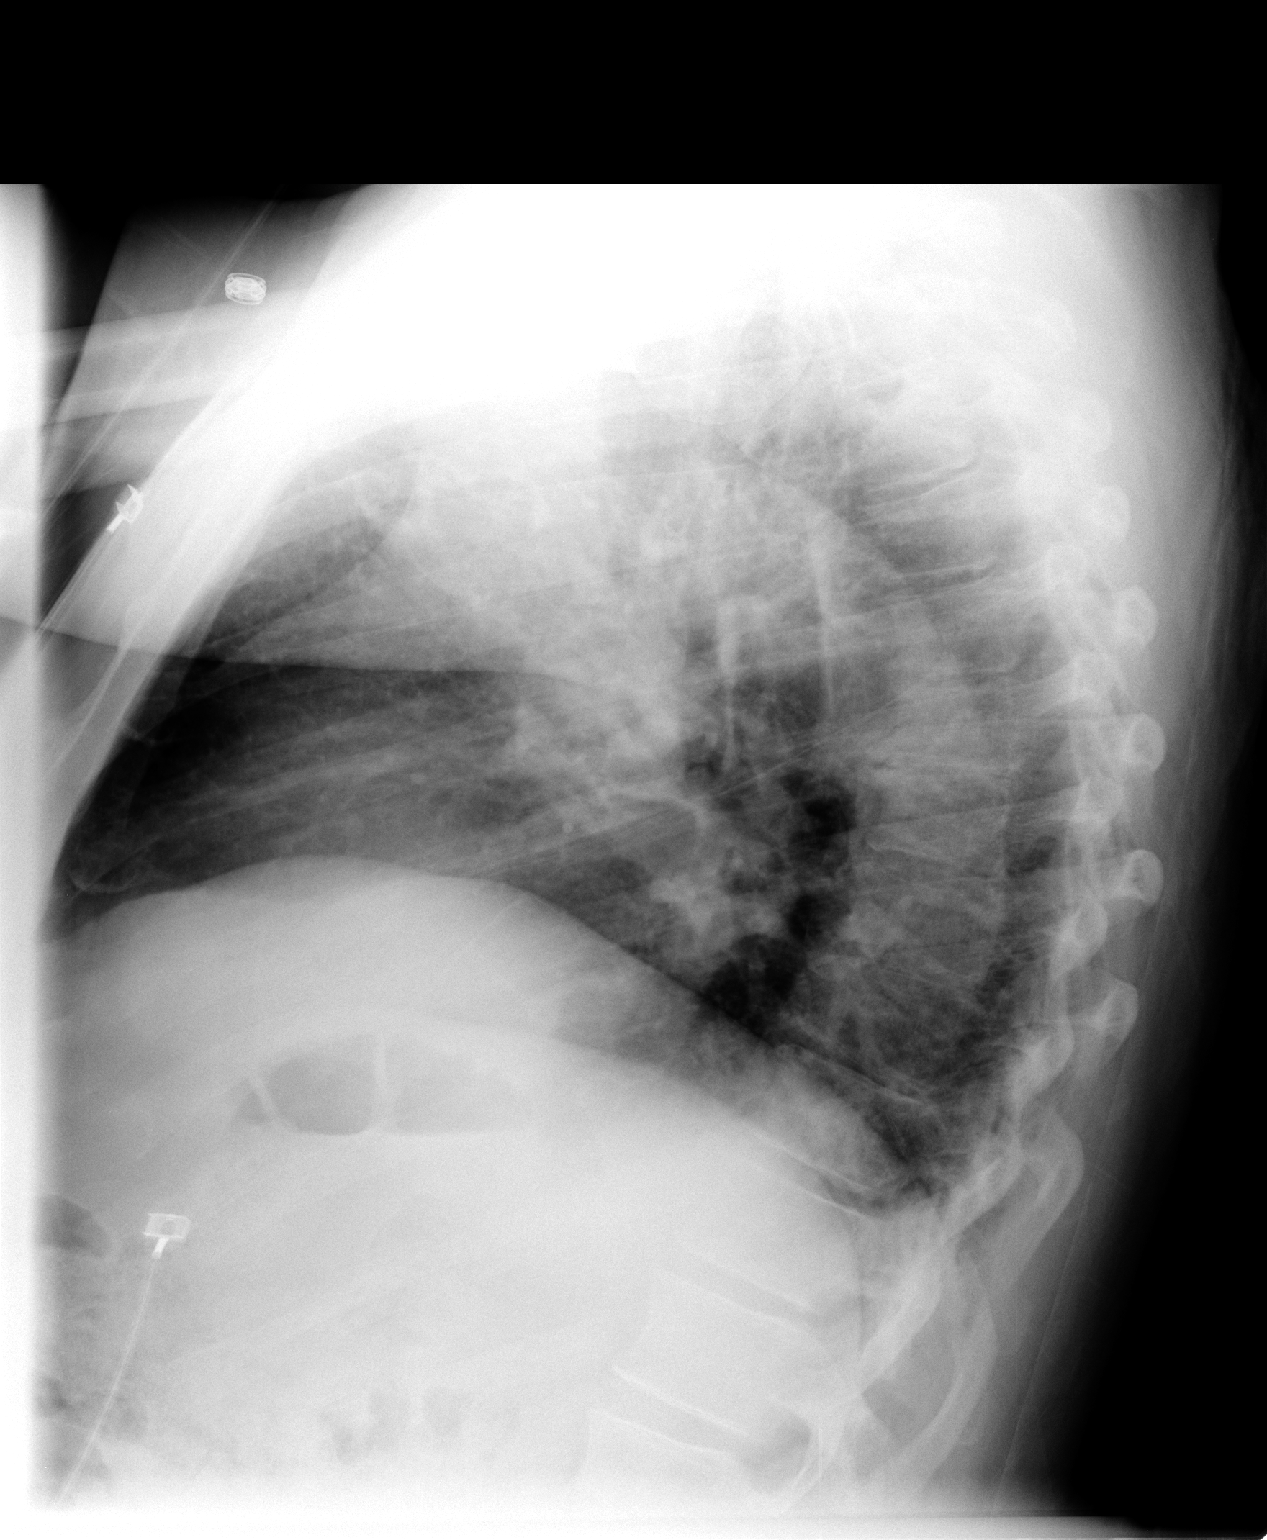

[2 of 2 positions shown; findings below may reference images not displayed]

FINDINGS: Shallow inspiration. Heart size and pulmonary vascularity are
normal. Calcified and tortuous aorta. Prominence of the right hilum
may be vascular but hilar lymphadenopathy is not excluded. Consider
follow-up with upright PA and lateral chest views for better
evaluation. Probable linear atelectasis in the left lung base. No
focal consolidation or airspace disease. No blunting of costophrenic
angles.
IMPRESSION: Shallow inspiration with atelectasis in the left lung base.
Nonspecific prominence of the right hilum. Consider follow-up with
upright PA and lateral views of the chest.

## 2020-01-31 DEATH — deceased
